# Patient Record
Sex: Male | Born: 1980 | Race: White | Hispanic: No | Marital: Married | Smoking: Never smoker
Health system: Southern US, Community
[De-identification: ages and names within clinical notes are randomized; demographics above are authoritative.]

## PROBLEM LIST (undated history)

## (undated) HISTORY — PX: LYMPHADENECTOMY: SHX15

---

## 2013-04-03 ENCOUNTER — Ambulatory Visit: Payer: Self-pay | Admitting: Internal Medicine

## 2014-05-16 LAB — TSH: TSH: 1.58 u[IU]/mL (ref <=5.90)

## 2014-05-16 LAB — LIPID PANEL
CHOLESTEROL: 222 mg/dL — AB (ref 0–200)
HDL: 41 mg/dL (ref 35–70)
LDL Cholesterol: 164 mg/dL
TRIGLYCERIDES: 83 mg/dL (ref 40–160)

## 2014-05-16 LAB — BASIC METABOLIC PANEL
BUN: 13 mg/dL (ref 4–21)
CREATININE: 1.2 mg/dL (ref <=1.3)

## 2014-05-16 LAB — CBC AND DIFFERENTIAL: HEMOGLOBIN: 14.9 g/dL (ref 13.5–17.5)

## 2015-02-26 ENCOUNTER — Other Ambulatory Visit: Payer: Self-pay | Admitting: Internal Medicine

## 2015-02-26 ENCOUNTER — Encounter: Payer: Self-pay | Admitting: Internal Medicine

## 2015-02-26 DIAGNOSIS — M25519 Pain in unspecified shoulder: Secondary | ICD-10-CM | POA: Insufficient documentation

## 2015-02-26 DIAGNOSIS — L409 Psoriasis, unspecified: Secondary | ICD-10-CM | POA: Insufficient documentation

## 2015-02-26 DIAGNOSIS — R002 Palpitations: Secondary | ICD-10-CM | POA: Insufficient documentation

## 2015-02-26 DIAGNOSIS — E785 Hyperlipidemia, unspecified: Secondary | ICD-10-CM | POA: Insufficient documentation

## 2015-04-15 IMAGING — CR DG SHOULDER 3+V*R*
1 series · 4 of 4 positions shown · non-contrast
Comparison: None.

CLINICAL DATA: AC joint region pain.  No trauma.

EXAM:
DG SHOULDER 3+ VIEWS RIGHT

[Series 1: axillary · 0.17mm/px · 4 of 4 slices shown]
[im 1/4]
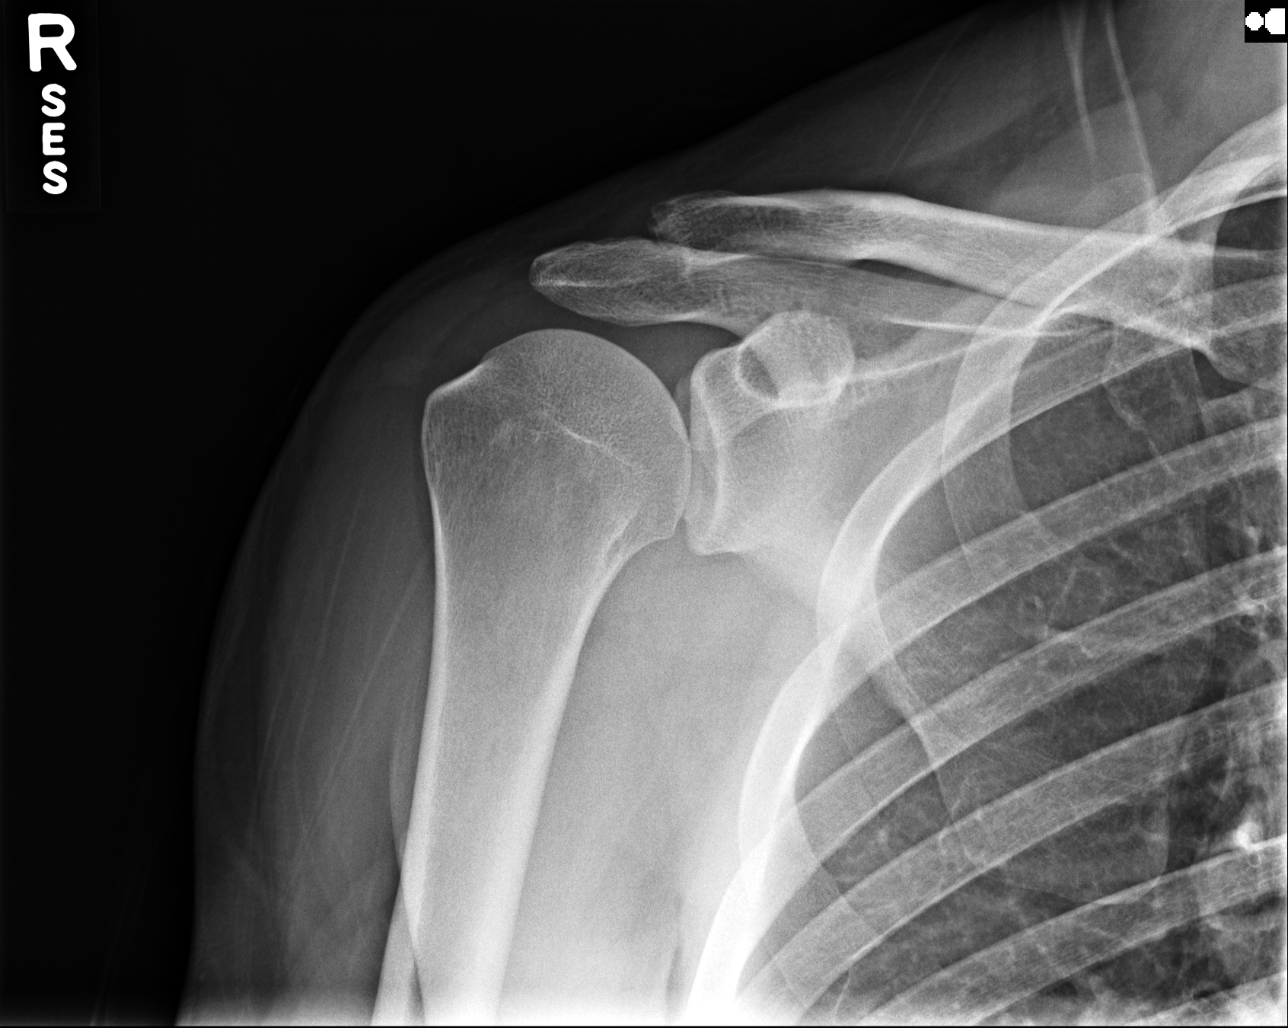
[im 2/4]
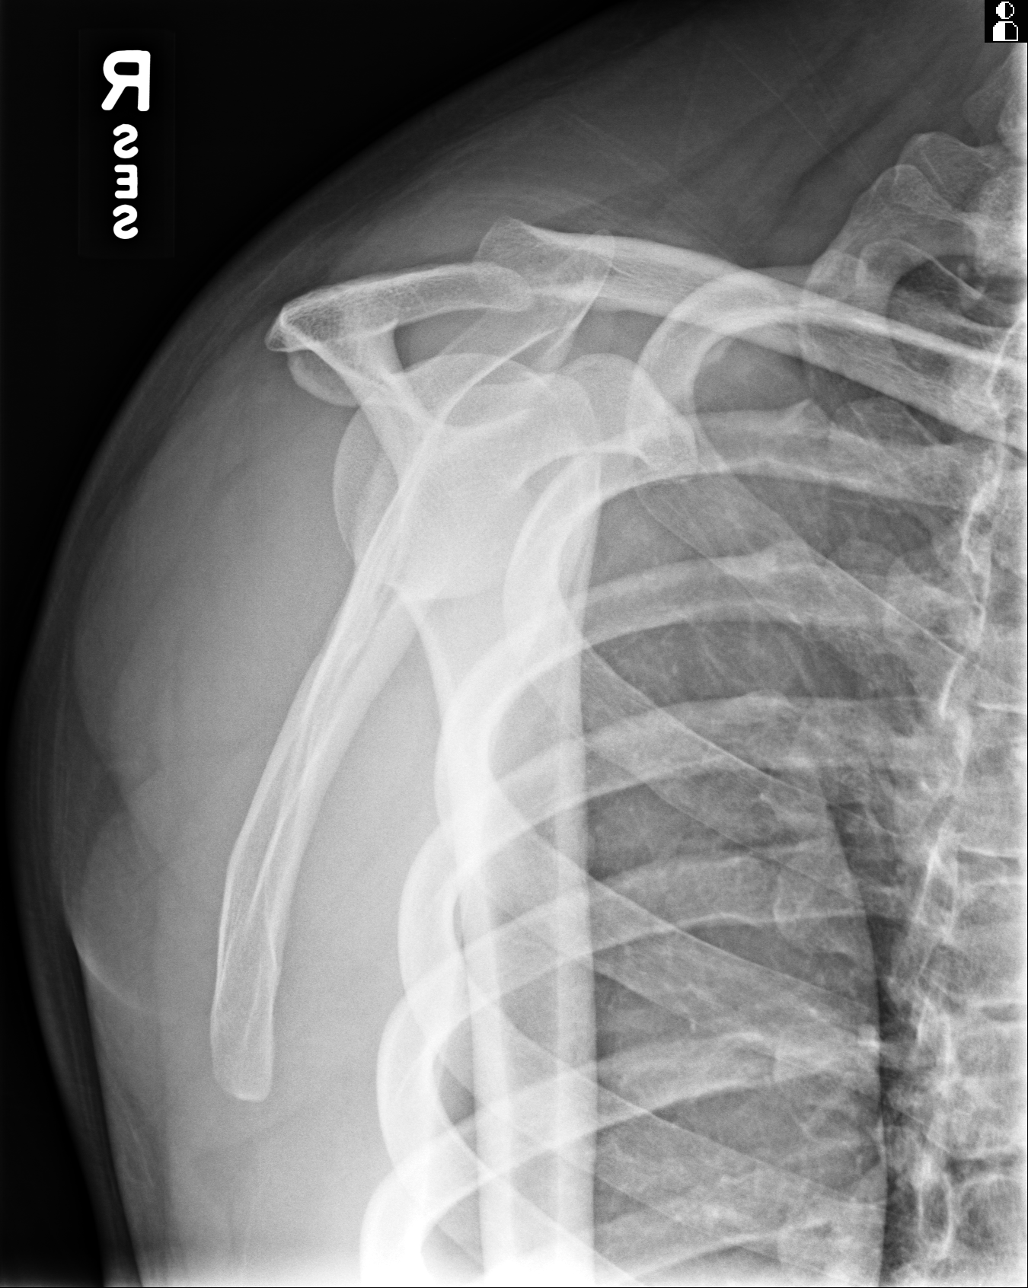
[im 3/4]
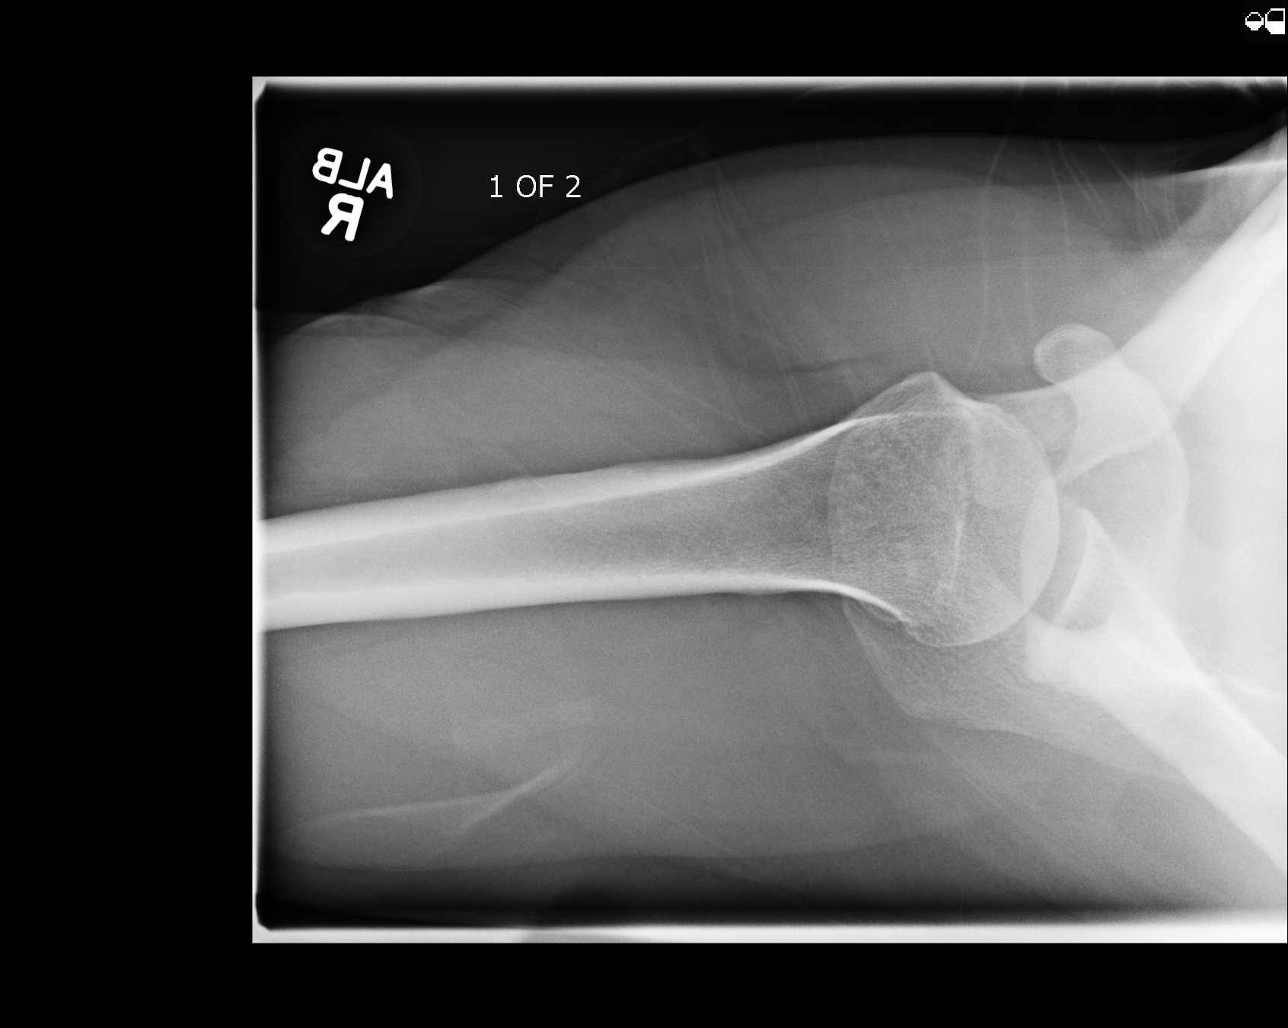
[im 4/4]
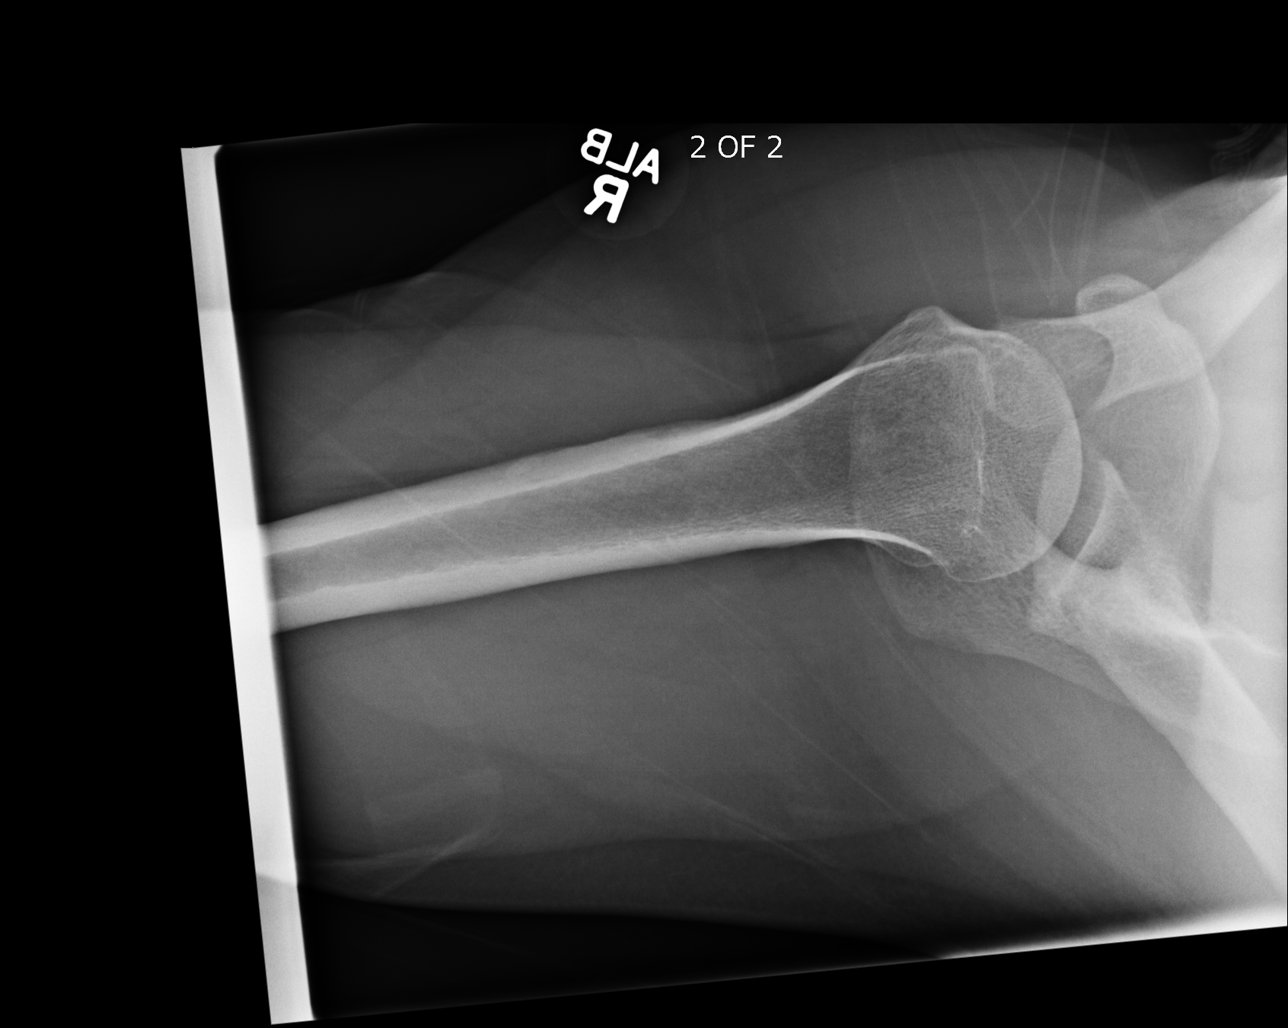

[4 of 4 positions shown; findings below may reference images not displayed]

FINDINGS: No fracture. No dislocation. There is subchondral irregularity of
the distal clavicle most likely due to osteoarthritis. There is no
significant marginal spurring. The glenohumeral joint is well
preserved. The underlying ribs are intact. Normal soft tissues.
IMPRESSION: Mild AC joint osteoarthritis.  No other abnormalities.

## 2015-08-06 ENCOUNTER — Encounter: Payer: Self-pay | Admitting: Internal Medicine

## 2015-08-06 ENCOUNTER — Ambulatory Visit (INDEPENDENT_AMBULATORY_CARE_PROVIDER_SITE_OTHER): Payer: 59 | Admitting: Internal Medicine

## 2015-08-06 VITALS — BP 140/88 | HR 58 | Temp 98.7°F | Resp 16 | Ht 74.0 in | Wt 251.0 lb

## 2015-08-06 DIAGNOSIS — Z0000 Encounter for general adult medical examination without abnormal findings: Secondary | ICD-10-CM

## 2015-08-06 DIAGNOSIS — L409 Psoriasis, unspecified: Secondary | ICD-10-CM

## 2015-08-06 DIAGNOSIS — H9313 Tinnitus, bilateral: Secondary | ICD-10-CM

## 2015-08-06 DIAGNOSIS — E785 Hyperlipidemia, unspecified: Secondary | ICD-10-CM

## 2015-08-06 LAB — POCT URINALYSIS DIPSTICK
BILIRUBIN UA: NEGATIVE
Blood, UA: NEGATIVE
Glucose, UA: NEGATIVE
KETONES UA: NEGATIVE
LEUKOCYTES UA: NEGATIVE
NITRITE UA: NEGATIVE
PROTEIN UA: NEGATIVE
Spec Grav, UA: 1.015
Urobilinogen, UA: 0.2
pH, UA: 6.5

## 2015-08-06 MED ORDER — TRIAMCINOLONE ACETONIDE 0.1 % EX CREA
1.0000 | TOPICAL_CREAM | Freq: Two times a day (BID) | CUTANEOUS | Status: AC
Start: 2015-08-06 — End: ?

## 2015-08-06 NOTE — Progress Notes (Signed)
Date:  08/06/2015   Name:  Oscar E Madan   DOB:  [birth year]   MRN:  [id number]   Chief Complaint: Annual Exam and Tinnitus Oscar Hanson is a 35 y.o. male who presents today for his Complete Annual Exam. He feels well. He reports exercising intermittently. He reports he is sleeping well.   HPI Tinnitus - intermittent symptoms for the past few months.  Mostly in the right ear when he is very quiet and still.  He denies any hearing loss or ear pain.  Review of Systems  Constitutional: Negative for fever, chills, fatigue and unexpected weight change.  HENT: Positive for tinnitus. Negative for congestion, hearing loss, sinus pressure and trouble swallowing.   Respiratory: Negative for cough, chest tightness, shortness of breath and wheezing.   Cardiovascular: Negative for chest pain, palpitations and leg swelling.  Gastrointestinal: Negative for abdominal pain, diarrhea, constipation and blood in stool.  Musculoskeletal: Negative for arthralgias.  Skin: Negative for rash.  Allergic/Immunologic: Positive for environmental allergies.  Neurological: Positive for light-headedness. Negative for dizziness, tremors, syncope and headaches.  Psychiatric/Behavioral: Negative for sleep disturbance and dysphoric mood.    Patient Active Problem List   Diagnosis Date Noted  . Awareness of heartbeats 02/26/2015  . Hyperlipidemia 02/26/2015  . Palpitations 02/26/2015  . Psoriasis 02/26/2015  . Pain in shoulder 02/26/2015    Prior to Admission medications   Medication Sig Start Date End Date Taking? Authorizing Provider  fluticasone (FLONASE) 50 MCG/ACT nasal spray Place 2 sprays into the nose daily as needed. 05/16/14  Yes Historical Provider, MD    No Known Allergies  Past Surgical History  Procedure Laterality Date  . Lymphadenectomy      Age 35.     Social History  Substance Use Topics  . Smoking status: Never Smoker   . Smokeless tobacco: None  . Alcohol Use: 2.4 oz/week    4  Standard drinks or equivalent per week    Medication list has been reviewed and updated.   Physical Exam  Constitutional: He is oriented to person, place, and time. He appears well-developed and well-nourished.  HENT:  Head: Normocephalic.  Right Ear: Hearing, tympanic membrane and external ear normal.  Left Ear: Hearing and external ear normal. A middle ear effusion is present.  Nose: Nose normal. Right sinus exhibits no maxillary sinus tenderness and no frontal sinus tenderness. Left sinus exhibits no maxillary sinus tenderness and no frontal sinus tenderness.  Mouth/Throat: Uvula is midline and oropharynx is clear and moist.  External canals scaly and dry  Eyes: Conjunctivae and EOM are normal. Pupils are equal, round, and reactive to light. Right eye exhibits no nystagmus. Left eye exhibits no nystagmus.  Neck: Normal range of motion. Neck supple. Carotid bruit is not present. No thyromegaly present.  Cardiovascular: Normal rate, regular rhythm, normal heart sounds and intact distal pulses.   Pulmonary/Chest: Effort normal and breath sounds normal. He has no wheezes. Right breast exhibits no mass. Left breast exhibits no mass.  Abdominal: Soft. Normal appearance and bowel sounds are normal. There is no hepatosplenomegaly. There is no tenderness.  Musculoskeletal: Normal range of motion.  Lymphadenopathy:    He has no cervical adenopathy.  Neurological: He is alert and oriented to person, place, and time. He has normal reflexes.  Skin: Skin is warm, dry and intact.  Psychiatric: He has a normal mood and affect. His speech is normal and behavior is normal. Judgment and thought content normal.  Nursing note and vitals  reviewed.   BP 140/88 mmHg  Pulse 58  Temp(Src) 98.7 F (37.1 C)  Resp 16  Ht  (1.88 m)  Wt 251 lb (113.853 kg)  BMI 32.21 kg/m2  Assessment and Plan: 1. Annual physical exam Discussed healthy diet and a regular exercise program - Comprehensive metabolic  panel - POCT urinalysis dipstick  2. Hyperlipidemia Will advise if medications are needed Low fat diet - Lipid panel  3. Psoriasis Mild, involving only external ear canals - triamcinolone cream (KENALOG) 0.1 %; Apply 1 application topically 2 (two) times daily.  Dispense: 30 g; Refill: 0 - CBC with Differential/Platelet  4. Tinnitus, bilateral Persistent with a left middle ear effusion - Ambulatory referral to ENT   Bari Edward, MD [facility]  08/06/2015

## 2015-08-07 LAB — COMPREHENSIVE METABOLIC PANEL
ALBUMIN: 4.4 g/dL (ref 3.5–5.5)
ALT: 29 IU/L (ref 0–44)
AST: 27 IU/L (ref 0–40)
Albumin/Globulin Ratio: 1.7 (ref 1.2–2.2)
Alkaline Phosphatase: 57 IU/L (ref 39–117)
BILIRUBIN TOTAL: 0.6 mg/dL (ref 0.0–1.2)
BUN / CREAT RATIO: 16 (ref 9–20)
BUN: 17 mg/dL (ref 6–20)
CHLORIDE: 99 mmol/L (ref 96–106)
CO2: 26 mmol/L (ref 18–29)
CREATININE: 1.07 mg/dL (ref 0.76–1.27)
Calcium: 9.1 mg/dL (ref 8.7–10.2)
GFR calc non Af Amer: 89 mL/min/{1.73_m2} (ref >59)
GFR, EST AFRICAN AMERICAN: 103 mL/min/{1.73_m2} (ref >59)
GLUCOSE: 76 mg/dL (ref 65–99)
Globulin, Total: 2.6 g/dL (ref 1.5–4.5)
Potassium: 4.8 mmol/L (ref 3.5–5.2)
Sodium: 141 mmol/L (ref 134–144)
TOTAL PROTEIN: 7 g/dL (ref 6.0–8.5)

## 2015-08-07 LAB — CBC WITH DIFFERENTIAL/PLATELET
BASOS ABS: 0 10*3/uL (ref 0.0–0.2)
Basos: 1 %
EOS (ABSOLUTE): 0.9 10*3/uL — ABNORMAL HIGH (ref 0.0–0.4)
Eos: 13 %
HEMOGLOBIN: 14.9 g/dL (ref 12.6–17.7)
Hematocrit: 44.5 % (ref 37.5–51.0)
IMMATURE GRANS (ABS): 0 10*3/uL (ref 0.0–0.1)
Immature Granulocytes: 0 %
Lymphocytes Absolute: 2.9 10*3/uL (ref 0.7–3.1)
Lymphs: 43 %
MCH: 29.6 pg (ref 26.6–33.0)
MCHC: 33.5 g/dL (ref 31.5–35.7)
MCV: 89 fL (ref 79–97)
MONOCYTES: 6 %
Monocytes Absolute: 0.4 10*3/uL (ref 0.1–0.9)
NEUTROS ABS: 2.4 10*3/uL (ref 1.4–7.0)
Neutrophils: 37 %
Platelets: 215 10*3/uL (ref 150–379)
RBC: 5.03 x10E6/uL (ref 4.14–5.80)
RDW: 13.8 % (ref 12.3–15.4)
WBC: 6.6 10*3/uL (ref 3.4–10.8)

## 2015-08-07 LAB — LIPID PANEL
CHOL/HDL RATIO: 5.1 ratio — AB (ref 0.0–5.0)
Cholesterol, Total: 198 mg/dL (ref 100–199)
HDL: 39 mg/dL — ABNORMAL LOW (ref >39)
LDL CALC: 145 mg/dL — AB (ref 0–99)
Triglycerides: 69 mg/dL (ref 0–149)
VLDL CHOLESTEROL CAL: 14 mg/dL (ref 5–40)

## 2015-08-11 ENCOUNTER — Telehealth: Payer: Self-pay

## 2015-08-11 NOTE — Telephone Encounter (Signed)
Spoke with patient. Patient advised of all results and verbalized understanding. Will call back with any future questions or concerns. [facility]  

## 2015-08-11 NOTE — Telephone Encounter (Signed)
-----   Message from Reubin MilanLaura H Berglund, MD sent at 08/07/2015  2:01 PM EDT ----- Labs are normal.  Cholesterol is slightly lower.  No medication is needed at this time.
